# Patient Record
Sex: Male | Born: 1975 | Race: White | Hispanic: No | Marital: Married | Smoking: Never smoker
Health system: Southern US, Community
[De-identification: ages and names within clinical notes are randomized; demographics above are authoritative.]

## PROBLEM LIST (undated history)

## (undated) HISTORY — PX: NO PAST SURGERIES: SHX2092

---

## 2013-10-28 DIAGNOSIS — Z0000 Encounter for general adult medical examination without abnormal findings: Secondary | ICD-10-CM | POA: Insufficient documentation

## 2019-01-03 ENCOUNTER — Other Ambulatory Visit: Payer: Self-pay | Admitting: Urology

## 2019-01-03 DIAGNOSIS — R1084 Generalized abdominal pain: Secondary | ICD-10-CM

## 2019-01-04 ENCOUNTER — Other Ambulatory Visit: Payer: Self-pay | Admitting: Urology

## 2019-01-04 DIAGNOSIS — R1084 Generalized abdominal pain: Secondary | ICD-10-CM

## 2019-01-04 DIAGNOSIS — R1013 Epigastric pain: Secondary | ICD-10-CM

## 2019-01-09 ENCOUNTER — Ambulatory Visit: Payer: BC Managed Care – PPO | Attending: Urology

## 2019-01-24 ENCOUNTER — Ambulatory Visit
Admission: RE | Admit: 2019-01-24 | Discharge: 2019-01-24 | Disposition: A | Discharge status: Home / Self Care | Payer: BC Managed Care – PPO | Source: Ambulatory Visit | Attending: Urology | Admitting: Urology

## 2019-01-24 ENCOUNTER — Other Ambulatory Visit: Payer: Self-pay

## 2019-01-24 DIAGNOSIS — R1084 Generalized abdominal pain: Secondary | ICD-10-CM | POA: Insufficient documentation

## 2019-01-24 DIAGNOSIS — R1013 Epigastric pain: Secondary | ICD-10-CM | POA: Insufficient documentation

## 2019-01-29 ENCOUNTER — Other Ambulatory Visit: Payer: Self-pay | Admitting: Urology

## 2019-01-29 DIAGNOSIS — R93429 Abnormal radiologic findings on diagnostic imaging of unspecified kidney: Secondary | ICD-10-CM

## 2019-02-04 ENCOUNTER — Ambulatory Visit: Payer: BC Managed Care – PPO

## 2019-02-12 ENCOUNTER — Ambulatory Visit: Admission: RE | Admit: 2019-02-12 | Payer: BC Managed Care – PPO | Source: Ambulatory Visit

## 2019-02-19 ENCOUNTER — Other Ambulatory Visit: Payer: Self-pay

## 2019-02-19 ENCOUNTER — Ambulatory Visit
Admission: RE | Admit: 2019-02-19 | Discharge: 2019-02-19 | Disposition: A | Discharge status: Home / Self Care | Payer: BC Managed Care – PPO | Source: Ambulatory Visit | Attending: Urology | Admitting: Urology

## 2019-02-19 DIAGNOSIS — R93429 Abnormal radiologic findings on diagnostic imaging of unspecified kidney: Secondary | ICD-10-CM | POA: Diagnosis not present

## 2019-02-19 MED ORDER — IOHEXOL 300 MG/ML  SOLN
150.0000 mL | Freq: Once | INTRAMUSCULAR | Status: AC | PRN
  Administered 2019-02-19: 125 mL via INTRAVENOUS

## 2019-02-26 ENCOUNTER — Inpatient Hospital Stay: Payer: BC Managed Care – PPO | Attending: Oncology | Admitting: Oncology

## 2019-02-26 ENCOUNTER — Encounter: Payer: Self-pay | Admitting: Oncology

## 2019-02-26 ENCOUNTER — Other Ambulatory Visit: Payer: Self-pay

## 2019-02-26 ENCOUNTER — Encounter (INDEPENDENT_AMBULATORY_CARE_PROVIDER_SITE_OTHER): Payer: Self-pay

## 2019-02-26 ENCOUNTER — Inpatient Hospital Stay: Payer: BC Managed Care – PPO

## 2019-02-26 VITALS — BP 146/90 | HR 62 | Temp 98.3°F | Wt 217.0 lb

## 2019-02-26 DIAGNOSIS — D7389 Other diseases of spleen: Secondary | ICD-10-CM | POA: Diagnosis not present

## 2019-02-26 DIAGNOSIS — Z801 Family history of malignant neoplasm of trachea, bronchus and lung: Secondary | ICD-10-CM | POA: Insufficient documentation

## 2019-02-26 DIAGNOSIS — Z807 Family history of other malignant neoplasms of lymphoid, hematopoietic and related tissues: Secondary | ICD-10-CM | POA: Insufficient documentation

## 2019-02-26 DIAGNOSIS — Z808 Family history of malignant neoplasm of other organs or systems: Secondary | ICD-10-CM | POA: Diagnosis not present

## 2019-02-26 NOTE — Progress Notes (Signed)
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 Patient stated that for several months he has had pelvic pain. He had not done anything until last month when he went to see Dr. Yves Dill and told him about the pain he had been having. Patient stated that Dr. Yves Dill had ordered a X-Ray, U/S and CT Scan and found the tumor on his spleen.

## 2019-02-26 NOTE — Progress Notes (Signed)
[facility]  [phone] Fax:(336) [phone]  ID: Ethan Vazquez OB: [birth year]  MR#: [id number]  AOZ#:[id number]  Patient Care Team: Patient, No Pcp Per as PCP - General (General Practice)  CHIEF COMPLAINT: Splenic lesions.  INTERVAL HISTORY: Patient is a 43 year old male who has been having intermittent abdominal pain over the past 8 months.  Subsequent work-up including CT scan did not reveal a distinct etiology, but did note incidentally several splenic lesions.  He currently feels well and is asymptomatic.  His last episode of pain was approximately 2 to 3 weeks ago.  He has no neurologic complaints.  He denies any recent fevers or illnesses.  He has a good appetite and denies weight loss.  He has no chest pain, shortness of breath, cough, or hemoptysis.  He denies any nausea, vomiting, constipation, or diarrhea.  He has known melena or hematochezia.  He has no urinary complaints.  Patient otherwise feels well and offers no further specific complaints today.  REVIEW OF SYSTEMS:   Review of Systems  Constitutional: Negative.  Negative for fever, malaise/fatigue and weight loss.  Respiratory: Negative.  Negative for cough, hemoptysis and shortness of breath.   Cardiovascular: Negative.  Negative for chest pain and leg swelling.  Gastrointestinal: Positive for abdominal pain. Negative for constipation, diarrhea, melena, nausea and vomiting.  Genitourinary: Negative.  Negative for dysuria.  Musculoskeletal: Negative.  Negative for back pain and myalgias.  Skin: Negative for rash.  Neurological: Negative.  Negative for dizziness, focal weakness, weakness and headaches.  Psychiatric/Behavioral: Negative.  The patient is not nervous/anxious.     As per HPI. Otherwise, a complete review of systems is negative.  PAST MEDICAL HISTORY: History reviewed. No pertinent past medical history.  PAST SURGICAL HISTORY: Past Surgical History:  Procedure Laterality  Date  . NO PAST SURGERIES      FAMILY HISTORY: Family History  Problem Relation Age of Onset  . Lymphoma Mother   . COPD Father   . Healthy Sister   . Skin cancer Paternal Grandmother   . Lung cancer Paternal Grandfather   . Healthy Sister   . Lung cancer Paternal Aunt     ADVANCED DIRECTIVES (Y/N):  N  HEALTH MAINTENANCE: Social History   Tobacco Use  . Smoking status: Never Smoker  . Smokeless tobacco: Never Used  Substance Use Topics  . Alcohol use: Yes    Alcohol/week: 24.0 standard drinks    Types: 24 Cans of beer per week  . Drug use: Never     Colonoscopy:  PAP:  Bone density:  Lipid panel:  No Known Allergies  No current outpatient medications on file.   No current facility-administered medications for this visit.    OBJECTIVE: Vitals:   02/26/19 1110  BP: (!) 146/90  Pulse: 62  Temp: 98.3 F (36.8 C)     There is no height or weight on file to calculate BMI.    ECOG FS:0 - Asymptomatic  General: Well-developed, well-nourished, no acute distress. Eyes: Pink conjunctiva, anicteric sclera. HEENT: Normocephalic, moist mucous membranes. Lungs: No audible wheezing or coughing. Heart: Regular rate and rhythm. Abdomen: Soft, nontender, no obvious distention.  No splenomegaly. Musculoskeletal: No edema, cyanosis, or clubbing. Neuro: Alert, answering all questions appropriately. Cranial nerves grossly intact. Skin: No rashes or petechiae noted. Psych: Normal affect.  LAB RESULTS:  No results found for: NA, K, CL, CO2, GLUCOSE, BUN, CREATININE, CALCIUM, PROT, ALBUMIN, AST, ALT, ALKPHOS, BILITOT, GFRNONAA, GFRAA  No results found for: WBC, NEUTROABS, HGB,  HCT, MCV, PLT   STUDIES: CT ABDOMEN PELVIS W WO CONTRAST  Result Date: 02/19/2019 CLINICAL DATA:  Follow-up abnormal ultrasound, renal calculus and splenic lesion EXAM: CT ABDOMEN AND PELVIS WITHOUT AND WITH CONTRAST TECHNIQUE: Multidetector CT imaging of the abdomen and pelvis was performed  following the standard protocol before and following the bolus administration of intravenous contrast. CONTRAST:  OMNIPAQUE IOHEXOL 300 MG/ML  SOLN COMPARISON:  01/24/2019 FINDINGS: Lower chest: No acute abnormality. Hepatobiliary: No solid liver abnormality is seen. Hepatic steatosis. No gallstones, gallbladder wall thickening, or biliary dilatation. Pancreas: Unremarkable. No pancreatic ductal dilatation or surrounding inflammatory changes. Spleen: Normal in size. There is very heterogeneous, nodular appearance of the spleen. Adrenals/Urinary Tract: Adrenal glands are unremarkable. Kidneys are normal, without renal calculi, solid lesion, or hydronephrosis. Bladder is unremarkable. Stomach/Bowel: Stomach is within normal limits. Appendix appears normal. No evidence of bowel wall thickening, distention, or inflammatory changes. Vascular/Lymphatic: Incidental note of retroaortic left renal vein. No enlarged abdominal or pelvic lymph nodes. Reproductive: No mass or other significant abnormality. Other: No abdominal wall hernia or abnormality. No abdominopelvic ascites. Musculoskeletal: No acute or significant osseous findings. IMPRESSION: 1. Very heterogeneous, nodular appearance of the spleen, which is normal in size. A more focal lesion described on prior ultrasound is not appreciated. These unusual findings are of uncertain significance, particularly in the outpatient setting. Differential considerations include sarcoidosis and miliary forms of infection. Extramedullary hematopoiesis could potentially have this appearance as well. Malignancy is not excluded although there are no other findings of concern in the abdomen or pelvis. Given wide variety of differential considerations, MRI may be helpful for further imaging characterization and biopsy may be considered for tissue diagnosis if indicated by clinical concern. 2. No urinary tract calculus or renal lesion appreciated to correspond to findings of prior  ultrasound. 3. Hepatic steatosis. Electronically Signed   By: Lauralyn Primes M.D.   On: 02/19/2019 14:18    ASSESSMENT: Splenic lesions.  PLAN:    1. Splenic lesions: CT scan results from February 19, 2019 reviewed independently and reported as above with a nodular appearance of the spleen, but no splenomegaly.  Will get MRI is recommended for further evaluation.  Laboratory work including flow cytometry, BCR-ABL, JAK2 mutation are pending at time of dictation.  Patient has his laboratory work drawn at [facility].  Patient will have video assisted telemedicine visit in 2 weeks to discuss his results and any additional diagnostic testing necessary.   I spent a total of 45 minutes face-to-face with the patient and reviewing chart data of which greater than 50% of the visit was spent in counseling and coordination of care as detailed above.  Patient expressed understanding and was in agreement with this plan. He also understands that He can call clinic at any time with any questions, concerns, or complaints.   Cancer Staging No matching staging information was found for the patient.  Jeralyn Ruths, MD   02/26/2019 12:49 PM

## 2019-02-27 ENCOUNTER — Encounter: Payer: Self-pay | Admitting: Oncology

## 2019-03-06 NOTE — Progress Notes (Signed)
[facility]  Telephone:(336) [phone] Fax:(336) [phone]  ID: Ethan Vazquez OB: 1975/11/30  MR#: [id number]  VCB#:[id number]  Patient Care Team: Patient, No Pcp Per as PCP - General (General Practice)  I connected with Ethan Vazquez on 03/11/19 at 10:45 AM EST by video enabled telemedicine visit and verified that I am speaking with the correct person using two identifiers.   I discussed the limitations, risks, security and privacy concerns of performing an evaluation and management service by telemedicine and the availability of in-person appointments. I also discussed with the patient that there may be a patient responsible charge related to this service. The patient expressed understanding and agreed to proceed.   Other persons participating in the visit and their role in the encounter: Patient, MD.  Patient's location: Home. Provider's location: Clinic.  CHIEF COMPLAINT: Splenic lesions consistent with extrapulmonary sarcoid.  INTERVAL HISTORY: Patient agreed to video enabled telemedicine visit for further evaluation and discussion of his lab and imaging results.  He continues to feel well and remains asymptomatic.  He has had no further episodes of abdominal pain.  He has no neurologic complaints.  He denies any recent fevers or illnesses.  He has a good appetite and denies weight loss.  He has no chest pain, shortness of breath, cough, or hemoptysis.  He denies any nausea, vomiting, constipation, or diarrhea.  He has known melena or hematochezia.  He has no urinary complaints.  Patient offers no specific complaints today.  REVIEW OF SYSTEMS:   Review of Systems  Constitutional: Negative.  Negative for fever, malaise/fatigue and weight loss.  Respiratory: Negative.  Negative for cough, hemoptysis and shortness of breath.   Cardiovascular: Negative.  Negative for chest pain and leg swelling.  Gastrointestinal: Negative.  Negative for abdominal pain, constipation,  diarrhea, melena, nausea and vomiting.  Genitourinary: Negative.  Negative for dysuria.  Musculoskeletal: Negative.  Negative for back pain and myalgias.  Skin: Negative for rash.  Neurological: Negative.  Negative for dizziness, focal weakness, weakness and headaches.  Psychiatric/Behavioral: Negative.  The patient is not nervous/anxious.     As per HPI. Otherwise, a complete review of systems is negative.  PAST MEDICAL HISTORY: History reviewed. No pertinent past medical history.  PAST SURGICAL HISTORY: Past Surgical History:  Procedure Laterality Date  . NO PAST SURGERIES      FAMILY HISTORY: Family History  Problem Relation Age of Onset  . Lymphoma Mother   . COPD Father   . Healthy Sister   . Skin cancer Paternal Grandmother   . Lung cancer Paternal Grandfather   . Healthy Sister   . Lung cancer Paternal Aunt     ADVANCED DIRECTIVES (Y/N):  N  HEALTH MAINTENANCE: Social History   Tobacco Use  . Smoking status: Never Smoker  . Smokeless tobacco: Never Used  Substance Use Topics  . Alcohol use: Yes    Alcohol/week: 24.0 standard drinks    Types: 24 Cans of beer per week  . Drug use: Never     Colonoscopy:  PAP:  Bone density:  Lipid panel:  No Known Allergies  No current outpatient medications on file.   No current facility-administered medications for this visit.    OBJECTIVE: There were no vitals filed for this visit.   There is no height or weight on file to calculate BMI.    ECOG FS:0 - Asymptomatic  General: Well-developed, well-nourished, no acute distress. HEENT: Normocephalic. Neuro: Alert, answering all questions appropriately. Cranial nerves grossly intact. Psych: Normal affect.  LAB RESULTS:  No results found for: NA, K, CL, CO2, GLUCOSE, BUN, CREATININE, CALCIUM, PROT, ALBUMIN, AST, ALT, ALKPHOS, BILITOT, GFRNONAA, GFRAA  No results found for: WBC, NEUTROABS, HGB, HCT, MCV, PLT   STUDIES: CT ABDOMEN PELVIS W WO  CONTRAST  Result Date: 02/19/2019 CLINICAL DATA:  Follow-up abnormal ultrasound, renal calculus and splenic lesion EXAM: CT ABDOMEN AND PELVIS WITHOUT AND WITH CONTRAST TECHNIQUE: Multidetector CT imaging of the abdomen and pelvis was performed following the standard protocol before and following the bolus administration of intravenous contrast. CONTRAST:  125mL OMNIPAQUE IOHEXOL 300 MG/ML  SOLN COMPARISON:  01/24/2019 FINDINGS: Lower chest: No acute abnormality. Hepatobiliary: No solid liver abnormality is seen. Hepatic steatosis. No gallstones, gallbladder wall thickening, or biliary dilatation. Pancreas: Unremarkable. No pancreatic ductal dilatation or surrounding inflammatory changes. Spleen: Normal in size. There is very heterogeneous, nodular appearance of the spleen. Adrenals/Urinary Tract: Adrenal glands are unremarkable. Kidneys are normal, without renal calculi, solid lesion, or hydronephrosis. Bladder is unremarkable. Stomach/Bowel: Stomach is within normal limits. Appendix appears normal. No evidence of bowel wall thickening, distention, or inflammatory changes. Vascular/Lymphatic: Incidental note of retroaortic left renal vein. No enlarged abdominal or pelvic lymph nodes. Reproductive: No mass or other significant abnormality. Other: No abdominal wall hernia or abnormality. No abdominopelvic ascites. Musculoskeletal: No acute or significant osseous findings. IMPRESSION: 1. Very heterogeneous, nodular appearance of the spleen, which is normal in size. A more focal lesion described on prior ultrasound is not appreciated. These unusual findings are of uncertain significance, particularly in the outpatient setting. Differential considerations include sarcoidosis and miliary forms of infection. Extramedullary hematopoiesis could potentially have this appearance as well. Malignancy is not excluded although there are no other findings of concern in the abdomen or pelvis. Given wide variety of differential  considerations, MRI may be helpful for further imaging characterization and biopsy may be considered for tissue diagnosis if indicated by clinical concern. 2. No urinary tract calculus or renal lesion appreciated to correspond to findings of prior ultrasound. 3. Hepatic steatosis. Electronically Signed   By: Lauralyn PrimesAlex  Bibbey M.D.   On: 02/19/2019 14:18   MR Abdomen W Wo Contrast  Result Date: 03/07/2019 CLINICAL DATA:  Abnormal CT of the abdomen and pelvis, follow-up examination. EXAM: MRI ABDOMEN WITHOUT AND WITH CONTRAST TECHNIQUE: Multiplanar multisequence MR imaging of the abdomen was performed both before and after the administration of intravenous contrast. CONTRAST:  9mL GADAVIST GADOBUTROL 1 MMOL/ML IV SOLN COMPARISON:  CT study of 02/19/2019 FINDINGS: Lower chest: No signs of effusion or evidence of consolidation. Hepatobiliary: No signs of focal hepatic lesion. No biliary ductal dilation. Pancreas:  Unremarkable appearance of the pancreas. Spleen: Spleen with multiple very small areas of intermediate to low T2 signal scattered throughout with similar pattern as exhibited on the previous CT evaluation. Spleen is normal size. Lesions display less diffusion related signal than the surrounding spleen though at higher B values only the larger areas are visible. On ADC there is no differential relative to lesions in background spleen. On postcontrast imaging the lesions become more conspicuous on the delayed phase suggesting delayed enhancement particularly on the coronal data set. Is Adrenals/Urinary Tract:  Normal appearance of the adrenal glands. Kidneys are unremarkable. Stomach/Bowel: Visualized gastrointestinal tract is unremarkable. Vascular/Lymphatic:  Vascular structures in the abdomen are patent. Other:  No ascites.  No adenopathy. Musculoskeletal: Multifocal areas of subtle differential diffusion and subtle delayed enhancement throughout all visualized skeletal structures. IMPRESSION: 1. Multifocal  splenic lesions in combination with bone lesions that is  most suggestive of sarcoidosis. Lesions do not have a typical pattern for lymphoma or metastatic disease. Amyloid involvement is also considered. Pattern is also atypical for amyloidosis given that the spleen is normal size and lesions are small and well circumscribed. Clinical and laboratory correlation may be helpful as an initial step in further evaluation. Three to six-month follow-up may also be helpful to assess for stability. Electronically Signed   By: Zetta Bills M.D.   On: 03/07/2019 12:09    ASSESSMENT: Splenic lesions consistent with extrapulmonary sarcoid.  PLAN:    1. Splenic lesions: MRI results from March 07, 2019 reviewed independently and reported as above with multifocal splenic lesions in combination with bone lesions most suggestive of extrapulmonary sarcoidosis.  Without splenomegaly and normal laboratory work, this is not suggestive of lymphoma or metastatic disease.  Have ordered an ACE level for completeness.  Will also refer to rheumatology for further evaluation.  No intervention is needed at this time.  Repeat MRI in 6 months to assess for interval change and then follow-up with video visit 1 to 2 days later.    I provided 15 minutes of face-to-face video visit time during this encounter which included chart review. Greater than 50% was spent counseling as documented under my assessment & plan.   Patient expressed understanding and was in agreement with this plan. He also understands that He can call clinic at any time with any questions, concerns, or complaints.    Lloyd Huger, MD   03/11/2019 11:46 AM

## 2019-03-07 ENCOUNTER — Other Ambulatory Visit: Payer: Self-pay

## 2019-03-07 ENCOUNTER — Encounter: Payer: Self-pay | Admitting: Oncology

## 2019-03-07 ENCOUNTER — Ambulatory Visit
Admission: RE | Admit: 2019-03-07 | Discharge: 2019-03-07 | Disposition: A | Discharge status: Home / Self Care | Payer: BC Managed Care – PPO | Source: Ambulatory Visit | Attending: Oncology | Admitting: Oncology

## 2019-03-07 DIAGNOSIS — D7389 Other diseases of spleen: Secondary | ICD-10-CM | POA: Insufficient documentation

## 2019-03-07 MED ORDER — GADOBUTROL 1 MMOL/ML IV SOLN
9.0000 mL | Freq: Once | INTRAVENOUS | Status: AC | PRN
  Administered 2019-03-07: 9 mL via INTRAVENOUS

## 2019-03-11 ENCOUNTER — Inpatient Hospital Stay: Payer: BC Managed Care – PPO | Attending: Oncology | Admitting: Oncology

## 2019-03-11 ENCOUNTER — Encounter: Payer: Self-pay | Admitting: Oncology

## 2019-03-11 DIAGNOSIS — D7389 Other diseases of spleen: Secondary | ICD-10-CM

## 2019-03-11 DIAGNOSIS — D869 Sarcoidosis, unspecified: Secondary | ICD-10-CM

## 2019-03-11 NOTE — Progress Notes (Signed)
Patient prescreened for appointment. Patient has no concerns or questions.  

## 2019-03-14 ENCOUNTER — Encounter: Payer: Self-pay | Admitting: Oncology

## 2019-03-15 ENCOUNTER — Encounter: Payer: Self-pay | Admitting: Oncology

## 2019-03-21 ENCOUNTER — Encounter: Payer: Self-pay | Admitting: Oncology

## 2019-03-22 ENCOUNTER — Encounter: Payer: Self-pay | Admitting: Oncology

## 2019-04-04 ENCOUNTER — Telehealth: Payer: Self-pay | Admitting: *Deleted

## 2019-04-04 ENCOUNTER — Other Ambulatory Visit: Payer: Self-pay | Admitting: Emergency Medicine

## 2019-04-04 DIAGNOSIS — D869 Sarcoidosis, unspecified: Secondary | ICD-10-CM

## 2019-04-04 NOTE — Telephone Encounter (Signed)
I just got off the phone with Willow Ora who has discussed the case with Dr. Gavin Potters.  It is my understanding that Dr. Gavin Potters has agreed to see Ethan Vazquez and we can resend the referral directly to him.  Thank you Theodoro Grist and Koren Bound!!   -Jorja Loa

## 2019-04-04 NOTE — Telephone Encounter (Signed)
Patient was referred to Rheumatology, but they called stating that rheumatology is not able to help heim and that he needs to be referred to Pulmonology but did not mae a referral. 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 Please return her call.

## 2019-07-12 ENCOUNTER — Other Ambulatory Visit: Payer: Self-pay | Admitting: Obstetrics and Gynecology

## 2019-07-12 DIAGNOSIS — Z319 Encounter for procreative management, unspecified: Secondary | ICD-10-CM

## 2019-07-12 NOTE — Progress Notes (Signed)
Semen analysis order placed. Wife, Apollo Timothy.

## 2019-08-29 NOTE — Progress Notes (Signed)
[name]  [phone] [phone]  ID: Ethan Vazquez OB: [birth year]  MR#: [id number]  HAL#:[id number]  Patient Care Team: Patient, No Pcp Per as PCP - General (General Practice) Lloyd Huger, MD as Consulting Physician (Oncology)  I connected with Ethan Vazquez on 09/04/19 at  2:30 PM EDT by video enabled telemedicine visit and verified that I am speaking with the correct person using two identifiers.   I discussed the limitations, risks, security and privacy concerns of performing an evaluation and management service by telemedicine and the availability of in-person appointments. I also discussed with the patient that there may be a patient responsible charge related to this service. The patient expressed understanding and agreed to proceed.   Other persons participating in the visit and their role in the encounter: Patient, MD.  Patient's location: Home. Provider's location: Clinic.  CHIEF COMPLAINT: Splenic lesions consistent with extrapulmonary sarcoid.  INTERVAL HISTORY: Patient agreed to video enabled telemedicine visit for further evaluation and discussion of his imaging results.  He continues to feel well and remains asymptomatic.  He does not complain of any abdominal pain.  He has no neurologic complaints.  He denies any recent fevers or illnesses.  He has a good appetite and denies weight loss.  He has no chest pain, shortness of breath, cough, or hemoptysis.  He denies any nausea, vomiting, constipation, or diarrhea.  He has known melena or hematochezia.  He has no urinary complaints.  Patient feels at his baseline offers no specific complaints today.  REVIEW OF SYSTEMS:   Review of Systems  Constitutional: Negative.  Negative for fever, malaise/fatigue and weight loss.  Respiratory: Negative.  Negative for cough, hemoptysis and shortness of breath.   Cardiovascular: Negative.  Negative for chest pain and leg swelling.    Gastrointestinal: Negative.  Negative for abdominal pain, constipation, diarrhea, melena, nausea and vomiting.  Genitourinary: Negative.  Negative for dysuria.  Musculoskeletal: Negative.  Negative for back pain and myalgias.  Skin: Negative for rash.  Neurological: Negative.  Negative for dizziness, focal weakness, weakness and headaches.  Psychiatric/Behavioral: Negative.  The patient is not nervous/anxious.     As per HPI. Otherwise, a complete review of systems is negative.  PAST MEDICAL HISTORY: History reviewed. No pertinent past medical history.  PAST SURGICAL HISTORY: Past Surgical History:  Procedure Laterality Date  . NO PAST SURGERIES      FAMILY HISTORY: Family History  Problem Relation Age of Onset  . Lymphoma Mother   . COPD Father   . Healthy Sister   . Skin cancer Paternal Grandmother   . Lung cancer Paternal Grandfather   . Healthy Sister   . Lung cancer Paternal Aunt     ADVANCED DIRECTIVES (Y/N):  N  HEALTH MAINTENANCE: Social History   Tobacco Use  . Smoking status: Never Smoker  . Smokeless tobacco: Never Used  Substance Use Topics  . Alcohol use: Yes    Alcohol/week: 24.0 standard drinks    Types: 24 Cans of beer per week  . Drug use: Never     Colonoscopy:  PAP:  Bone density:  Lipid panel:  No Known Allergies  No current outpatient medications on file.   No current facility-administered medications for this visit.    OBJECTIVE: There were no vitals filed for this visit.   There is no height or weight on file to calculate BMI.    ECOG FS:0 - Asymptomatic  General: Well-developed, well-nourished, no acute distress. HEENT: Normocephalic. Neuro: Alert,  answering all questions appropriately. Cranial nerves grossly intact. Psych: Normal affect.  LAB RESULTS:  No results found for: NA, K, CL, CO2, GLUCOSE, BUN, CREATININE, CALCIUM, PROT, ALBUMIN, AST, ALT, ALKPHOS, BILITOT, GFRNONAA, GFRAA  No results found for: WBC, NEUTROABS,  HGB, HCT, MCV, PLT   STUDIES: MR Abdomen W Wo Contrast  Result Date: 09/02/2019 CLINICAL DATA:  Surveillance/follow up of nonspecific splenic lesion EXAM: MRI ABDOMEN WITHOUT AND WITH CONTRAST TECHNIQUE: Multiplanar multisequence MR imaging of the abdomen was performed both before and after the administration of intravenous contrast. CONTRAST:  41mL GADAVIST GADOBUTROL 1 MMOL/ML IV SOLN COMPARISON:  Multiple exams, including 03/07/2019 FINDINGS: Lower chest: Unremarkable Hepatobiliary: Unremarkable Pancreas:  Unremarkable Spleen: As on the prior exam, there are initial hypoenhancing scattered nodularity in the spleen subsequently demonstrating individual nodular enhancement throughout the spleen on delayed phase images even more pronounced on the 3 minutes images. Lesions measure up to about 1 cm in diameter and are enumerable throughout the spleen. These lesions are isointense on T1 weighted images although there is a subtle granular hypointensity in the spleen on the T2 axial fat saturated series # 6 possibly indicating some mild hypointensity of the lesions on T2 weighted images. The spleen measures 13.3 by 4.6 by 10.0 cm (volume = 320 cm^3), normal). Possibilities for these numerous splenic lesions may include atypical extrapulmonary sarcoidosis, littoral cell angioma, hemangiomatosis, lymphoma, atypical infection, or metastatic disease. Infection seems less likely given the chronicity, and the appearance is not progressive and is without associated splenomegaly to suggest lymphoma/metastatic disease. Adrenals/Urinary Tract:  Unremarkable Stomach/Bowel: Unremarkable Vascular/Lymphatic:  Unremarkable Other:  No supplemental non-categorized findings. Musculoskeletal: Stable small scattered foci of enhancement in the marrow of the spine which do not appear progressive IMPRESSION: 1. Stable appearance of the mildly enlarged spleen with initial hypoenhancing and later phase hyperenhancing scattered nodularity  throughout the spleen. No splenomegaly or progression is identified and there continue to be some small enhancing lesions in the bone marrow. Possibilities include atypical extrapulmonary sarcoidosis, lymphoma, atypical infection, or possibly littoral cell angioma. Continued correlation with any other supportive evidence or serologic indicators of malignancy suggested. If continued surveillance is warranted, one complementary imaging option that may be helpful is PET-CT. Electronically Signed   By: Gaylyn Rong M.D.   On: 09/02/2019 08:53    ASSESSMENT: Splenic lesions consistent with extrapulmonary sarcoid.  PLAN:    1. Splenic lesions: Repeat MRI from September 02, 2019 reviewed independently and reported as above with stable appearance of mildly enlarged spleen and scattered nodularity throughout the spleen.  No progression or changes have been identified.  Previously, all of his other laboratory work including flow cytometry, BCR-ABL mutation, and JAK2 mutation are all either negative or within normal limits.  ACE levels to assess for possible sarcoidosis were reported at 75 which is in within normal limits.  This is highly unlikely an underlying lymphoma or myeloproliferative disorder, but extrapulmonary sarcoidosis remains on the differential despite the normal ACE level.  After discussion with the patient, is agreed upon that no further follow-up or imaging is necessary.  Please refer patient back if there are any questions or concerns.    I provided 20 minutes of face-to-face video visit time during this encounter which included chart review, counseling, and coordination of care as documented above.    Patient expressed understanding and was in agreement with this plan. He also understands that He can call clinic at any time with any questions, concerns, or complaints.    Tollie Pizza  Orlie Dakin, MD   09/04/2019 6:47 AM

## 2019-09-02 ENCOUNTER — Other Ambulatory Visit: Payer: Self-pay

## 2019-09-02 ENCOUNTER — Ambulatory Visit
Admission: RE | Admit: 2019-09-02 | Discharge: 2019-09-02 | Disposition: A | Discharge status: Home / Self Care | Payer: BC Managed Care – PPO | Source: Ambulatory Visit | Attending: Oncology | Admitting: Oncology

## 2019-09-02 DIAGNOSIS — D7389 Other diseases of spleen: Secondary | ICD-10-CM

## 2019-09-02 MED ORDER — GADOBUTROL 1 MMOL/ML IV SOLN
10.0000 mL | Freq: Once | INTRAVENOUS | Status: AC | PRN
  Administered 2019-09-02: 10 mL via INTRAVENOUS

## 2019-09-03 ENCOUNTER — Encounter: Payer: Self-pay | Admitting: Oncology

## 2019-09-03 ENCOUNTER — Inpatient Hospital Stay: Payer: BC Managed Care – PPO | Attending: Oncology | Admitting: Oncology

## 2019-09-03 DIAGNOSIS — D7389 Other diseases of spleen: Secondary | ICD-10-CM

## 2019-09-03 NOTE — Progress Notes (Signed)
Patient prescreened for appointment. Patient has no concerns or questions.  

## 2020-12-13 IMAGING — MR MR ABDOMEN WO/W CM
17 series · 46 of 48 positions shown · IV contrast (gadavist)
Comparison: Multiple exams, including 03/07/2019

CLINICAL DATA: Surveillance/follow up of nonspecific splenic lesion

EXAM:
MRI ABDOMEN WITHOUT AND WITH CONTRAST
TECHNIQUE: Multiplanar multisequence MR imaging of the abdomen was performed
both before and after the administration of intravenous contrast.
CONTRAST:  10mL GADAVIST GADOBUTROL 1 MMOL/ML IV SOLN

[Series 3: T2 · coronal · 6.0mm · 1.19mm/px · 2 of 30 slices shown (1 of 2)]
[im 1/30]
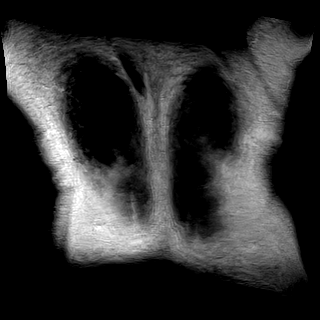
[im 30/30]
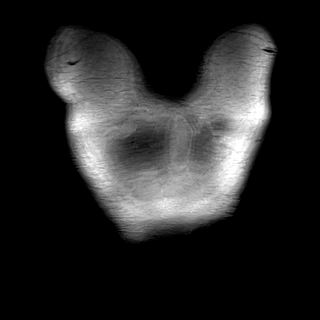

[Series 4: T2 · axial · 6.0mm · 1.19mm/px · z∈[-74,+178]mm · 3 of 36 slices shown (2 of 2)]
[im 1/36]
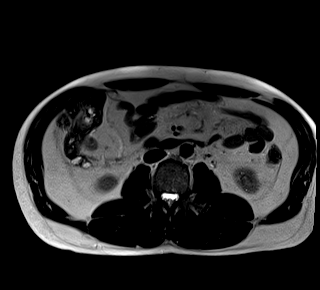
[im 18/36]
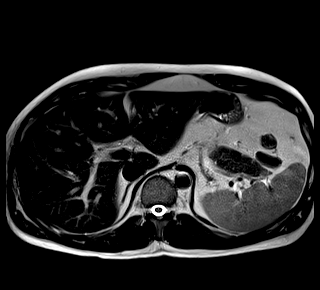
[im 36/36]
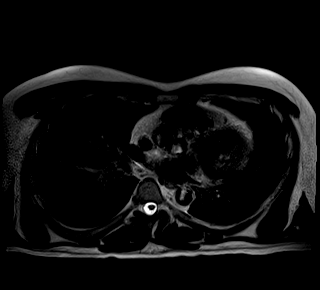

[Series 6: T2 fat-sat · axial · 6.0mm · 1.19mm/px · z∈[-67,+170]mm · 2 of 34 slices shown]
[im 1/34]
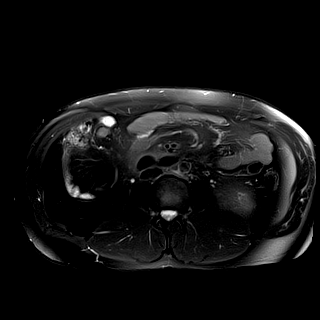
[im 34/34]
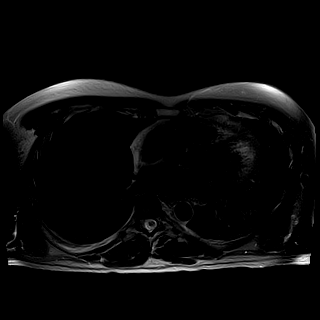

[Series 7: ax dwi_tracew · axial · 6.0mm · 1.42mm/px · z∈[-67,+170]mm · 5 of 102 slices shown]
[im 1/102]
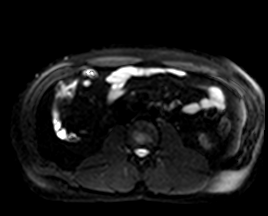
[im 26/102]
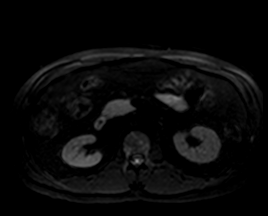
[im 51/102]
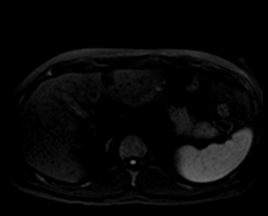
[im 76/102]
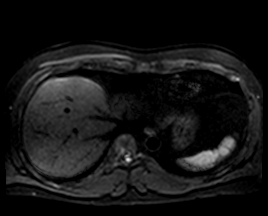
[im 102/102]
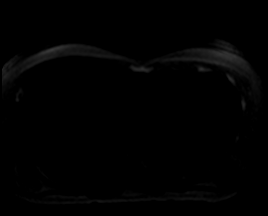

[Series 8: ax dwi_adc · axial · 6.0mm · 1.42mm/px · 1 of 34 slices shown]
[im 1/34]
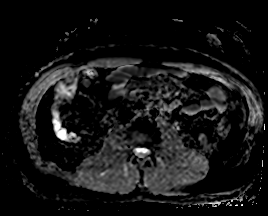

[Series 9: T1 · axial · 6.0mm · 0.74mm/px · z∈[-74,+178]mm · 3 of 72 slices shown]
[im 1/72]
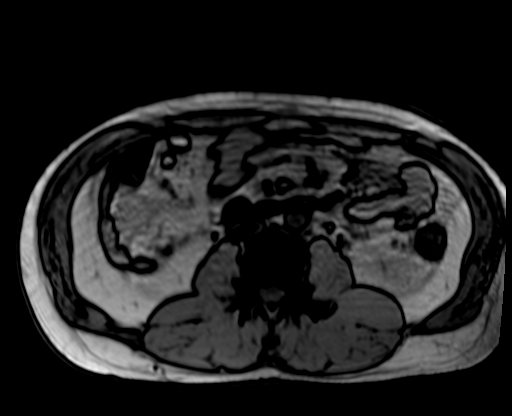
[im 36/72]
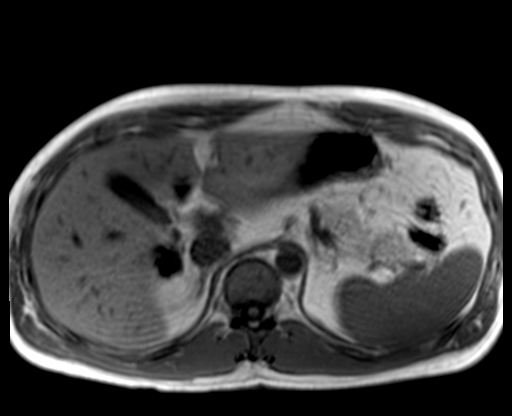
[im 72/72]
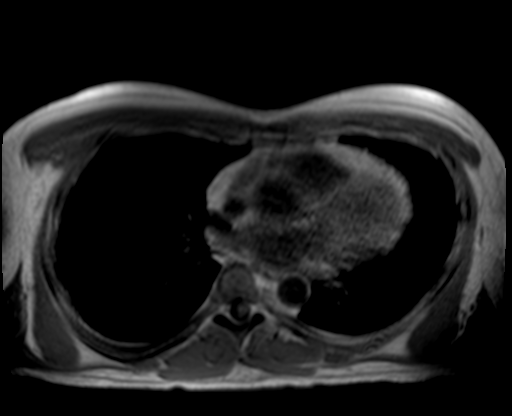

[Series 10: bSSFP · axial · 6.0mm · 0.74mm/px · z∈[-74,+178]mm · 2 of 36 slices shown]
[im 1/36]
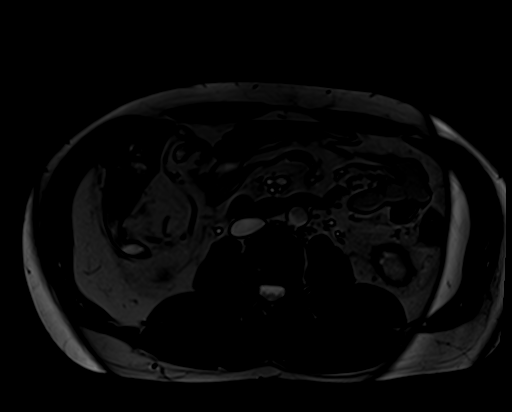
[im 36/36]
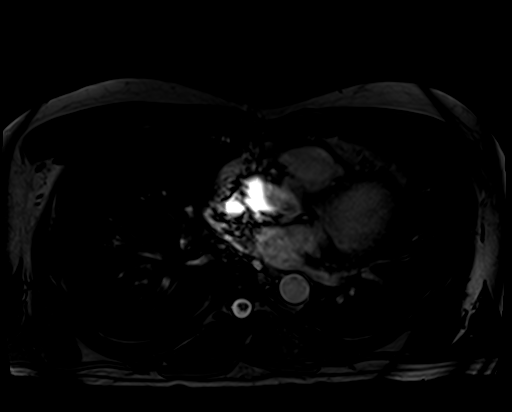

[Series 11: T1 dynamic fat-sat · axial · non-contrast · 3.3mm · 1.19mm/px · z∈[-79,+182]mm · 3 of 80 slices shown (1 of 5)]
[im 1/80]
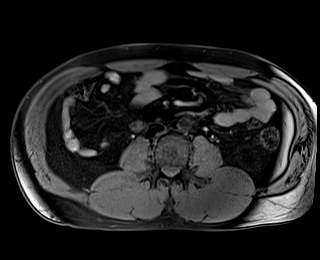
[im 40/80]
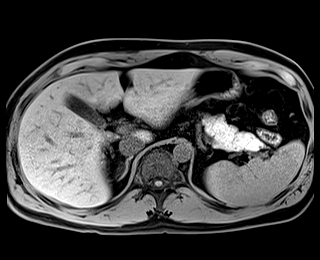
[im 80/80]
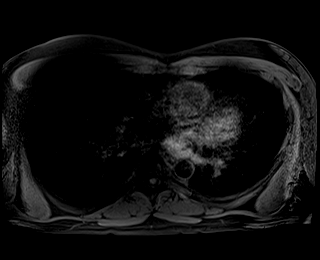

[Series 12: T1 dynamic fat-sat post-contrast · axial · 3.3mm · 1.19mm/px · z∈[-79,+182]mm · 3 of 80 slices shown (1 of 4)]
[im 1/80]
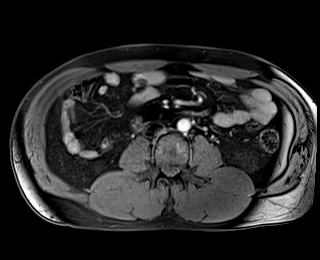
[im 40/80]
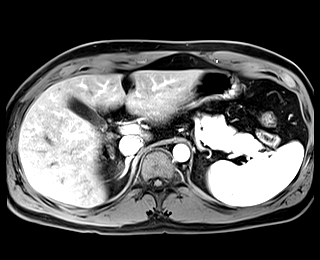
[im 80/80]
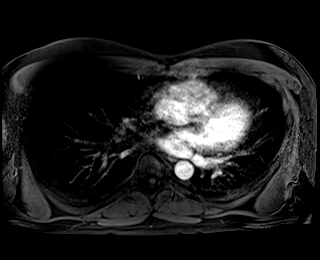

[Series 13: T1 dynamic fat-sat · axial · 3.3mm · 1.19mm/px · z∈[-79,+182]mm · 3 of 80 slices shown (2 of 5)]
[im 1/80]
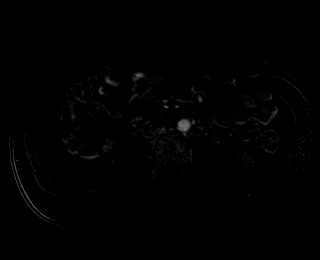
[im 40/80]
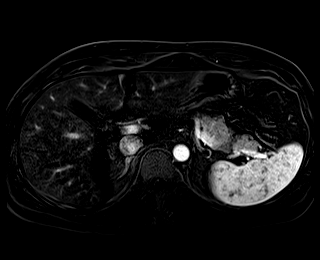
[im 80/80]
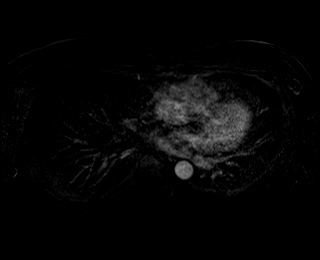

[Series 14: T1 dynamic fat-sat post-contrast · axial · 3.3mm · 1.19mm/px · z∈[-79,+182]mm · 3 of 80 slices shown (2 of 4)]
[im 1/80]
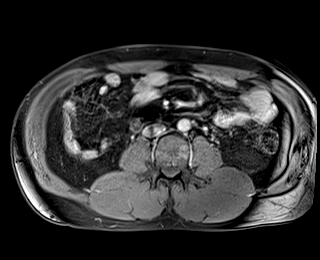
[im 40/80]
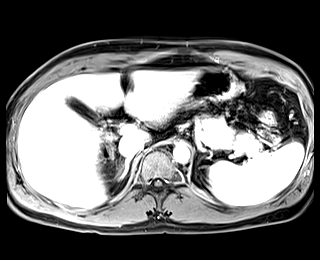
[im 80/80]
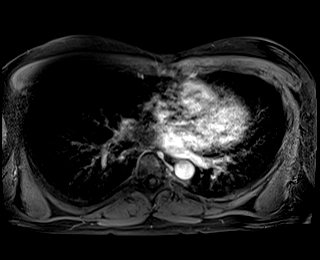

[Series 15: T1 dynamic fat-sat · axial · 3.3mm · 1.19mm/px · z∈[-79,+182]mm · 3 of 80 slices shown (3 of 5)]
[im 1/80]
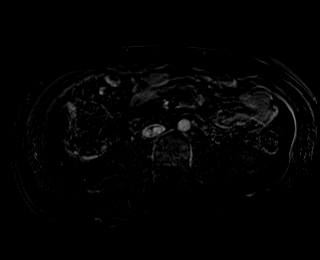
[im 40/80]
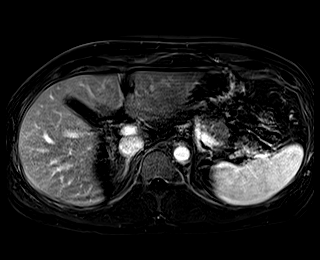
[im 80/80]
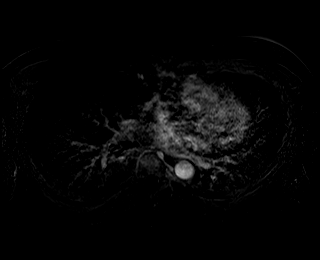

[Series 16: T1 dynamic fat-sat post-contrast · axial · 3.3mm · 1.19mm/px · z∈[-79,+182]mm · 3 of 80 slices shown (3 of 4)]
[im 1/80]
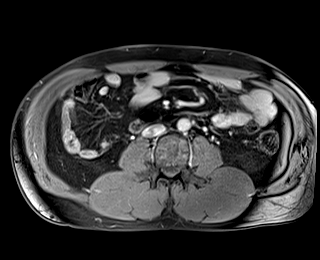
[im 40/80]
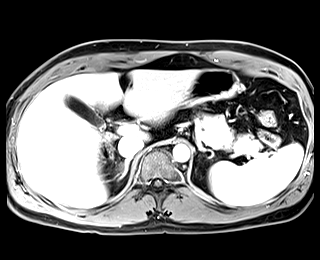
[im 80/80]
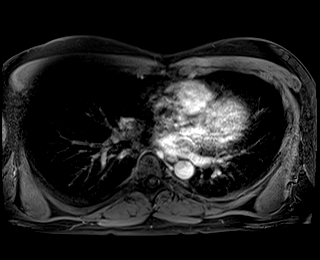

[Series 17: T1 dynamic fat-sat · axial · 3.3mm · 1.19mm/px · z∈[-79,+182]mm · 3 of 80 slices shown (4 of 5)]
[im 1/80]
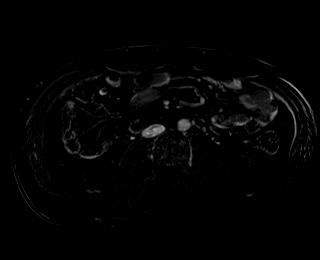
[im 40/80]
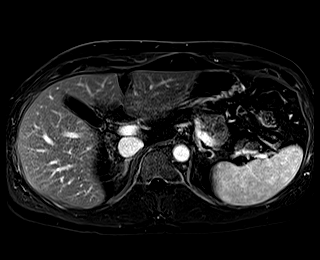
[im 80/80]
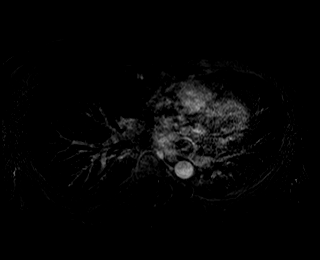

[Series 18: T1 dynamic post-contrast · coronal · 3.0mm · 1.31mm/px · 3 of 72 slices shown]
[im 1/72]
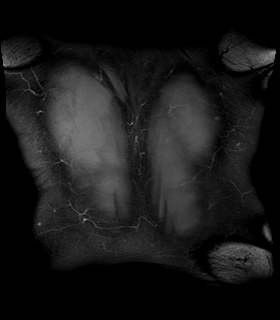
[im 36/72]
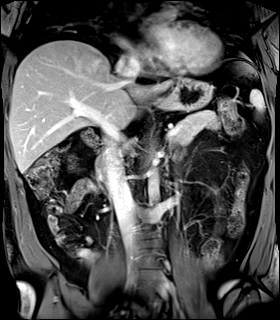
[im 72/72]
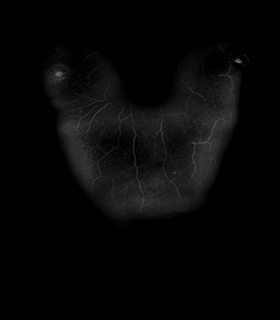

[Series 19: T1 dynamic fat-sat post-contrast · axial · 3.3mm · 1.19mm/px · z∈[-79,+182]mm · 3 of 80 slices shown (4 of 4)]
[im 1/80]
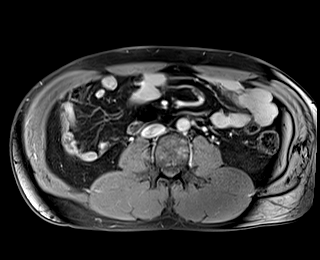
[im 40/80]
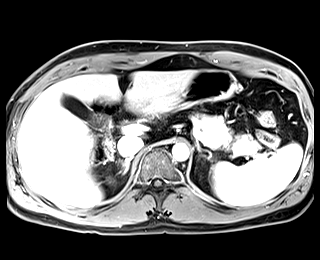
[im 80/80]
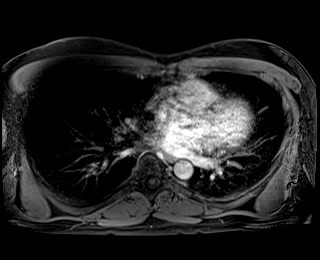

[Series 20: T1 dynamic fat-sat · axial · 3.3mm · 1.19mm/px · 1 of 80 slices shown (5 of 5)]
[im 1/80]
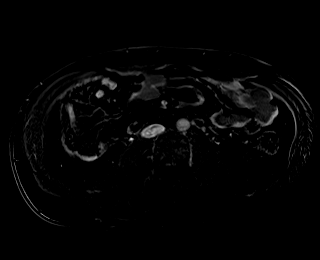

[46 of 48 positions shown; findings below may reference images not displayed]

FINDINGS: Lower chest: Unremarkable

Hepatobiliary: Unremarkable

Pancreas:  Unremarkable

Spleen: As on the prior exam, there are initial hypoenhancing
scattered nodularity in the spleen subsequently demonstrating
individual nodular enhancement throughout the spleen on delayed
phase images even more pronounced on the 3 minutes images. Lesions
measure up to about 1 cm in diameter and are enumerable throughout
the spleen. These lesions are isointense on T1 weighted images
although there is a subtle granular hypointensity in the spleen on
the T2 axial fat saturated series # 6 possibly indicating some mild
hypointensity of the lesions on T2 weighted images. The spleen
measures 13.3 by 4.6 by 10.0 cm (volume = 320 cm^3), normal).
Possibilities for these numerous splenic lesions may include
atypical extrapulmonary sarcoidosis, littoral cell angioma,
hemangiomatosis, lymphoma, atypical infection, or metastatic
disease. Infection seems less likely given the chronicity, and the
appearance is not progressive and is without associated splenomegaly
to suggest lymphoma/metastatic disease.

Adrenals/Urinary Tract:  Unremarkable

Stomach/Bowel: Unremarkable

Vascular/Lymphatic:  Unremarkable

Other:  No supplemental non-categorized findings.

Musculoskeletal: Stable small scattered foci of enhancement in the
marrow of the spine which do not appear progressive
IMPRESSION: 1. Stable appearance of the mildly enlarged spleen with initial
hypoenhancing and later phase hyperenhancing scattered nodularity
throughout the spleen. No splenomegaly or progression is identified
and there continue to be some small enhancing lesions in the bone
marrow. Possibilities include atypical extrapulmonary sarcoidosis,
lymphoma, atypical infection, or possibly littoral cell angioma.
Continued correlation with any other supportive evidence or
serologic indicators of malignancy suggested. If continued
surveillance is warranted, one complementary imaging option that may
be helpful is PET-CT.
